# Patient Record
Sex: Female | Born: 2005
Health system: Southern US, Community
[De-identification: ages and names within clinical notes are randomized; demographics above are authoritative.]

## PROBLEM LIST (undated history)

## (undated) DIAGNOSIS — F902 Attention-deficit hyperactivity disorder, combined type: Principal | ICD-10-CM

## (undated) DIAGNOSIS — R278 Other lack of coordination: Secondary | ICD-10-CM

## (undated) HISTORY — DX: Other lack of coordination: R27.8

## (undated) HISTORY — DX: Attention-deficit hyperactivity disorder, combined type: F90.2

---
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
## 2025-05-01 ENCOUNTER — Ambulatory Visit: Admitting: Family Medicine
